# Patient Record
Sex: Female | Born: 2000 | Race: White | Hispanic: No | Marital: Single | Smoking: Never smoker
Health system: Southern US, Community
[De-identification: ages and names within clinical notes are randomized; demographics above are authoritative.]

---

## 2022-02-07 ENCOUNTER — Emergency Department: Payer: Managed Care, Other (non HMO)

## 2022-02-07 ENCOUNTER — Emergency Department
Admission: EM | Admit: 2022-02-07 | Discharge: 2022-02-07 | Disposition: A | Discharge status: Home / Self Care | Payer: Managed Care, Other (non HMO) | Attending: Student in an Organized Health Care Education/Training Program | Admitting: Student in an Organized Health Care Education/Training Program

## 2022-02-07 ENCOUNTER — Other Ambulatory Visit: Payer: Self-pay

## 2022-02-07 DIAGNOSIS — R209 Unspecified disturbances of skin sensation: Secondary | ICD-10-CM | POA: Insufficient documentation

## 2022-02-07 DIAGNOSIS — F430 Acute stress reaction: Secondary | ICD-10-CM | POA: Insufficient documentation

## 2022-02-07 DIAGNOSIS — R079 Chest pain, unspecified: Secondary | ICD-10-CM | POA: Diagnosis present

## 2022-02-07 DIAGNOSIS — R000 Tachycardia, unspecified: Secondary | ICD-10-CM | POA: Insufficient documentation

## 2022-02-07 LAB — CBC
HCT: 44.8 % (ref 36.0–46.0)
Hemoglobin: 14.8 g/dL (ref 12.0–15.0)
MCH: 29 pg (ref 26.0–34.0)
MCHC: 33 g/dL (ref 30.0–36.0)
MCV: 87.8 fL (ref 80.0–100.0)
Platelets: 332 10*3/uL (ref 150–400)
RBC: 5.1 MIL/uL (ref 3.87–5.11)
RDW: 13.1 % (ref 11.5–15.5)
WBC: 9.6 10*3/uL (ref 4.0–10.5)
nRBC: 0 % (ref 0.0–0.2)

## 2022-02-07 LAB — BASIC METABOLIC PANEL WITH GFR
Anion gap: 13 (ref 5–15)
BUN: 15 mg/dL (ref 6–20)
CO2: 25 mmol/L (ref 22–32)
Calcium: 9.5 mg/dL (ref 8.9–10.3)
Chloride: 102 mmol/L (ref 98–111)
Creatinine, Ser: 0.88 mg/dL (ref 0.44–1.00)
GFR, Estimated: >60 mL/min
Glucose, Bld: 126 mg/dL — ABNORMAL HIGH (ref 70–99)
Potassium: 3.5 mmol/L (ref 3.5–5.1)
Sodium: 140 mmol/L (ref 135–145)

## 2022-02-07 LAB — TROPONIN I (HIGH SENSITIVITY): Troponin I (High Sensitivity): <2 ng/L

## 2022-02-07 LAB — D-DIMER, QUANTITATIVE: D-Dimer, Quant: <0.27 ug/mL-FEU (ref 0.00–0.50)

## 2022-02-07 MED ORDER — DIAZEPAM 5 MG PO TABS
5.0000 mg | ORAL_TABLET | Freq: Once | ORAL | Status: AC
  Administered 2022-02-07: 5 mg via ORAL
  Filled 2022-02-07: qty 1

## 2022-02-07 NOTE — ED Notes (Signed)
Discharge instructions discussed with pt. Pt verbalized understanding with no questions at this time. Pt to go home with friend at bedside ?

## 2022-02-07 NOTE — ED Triage Notes (Signed)
Pt with c/o chest tightness and SOB since this am, pt with c/o left arm tingling. Pt states she has been under a lot of stress at school during finals.

## 2022-02-07 NOTE — ED Provider Notes (Signed)
[facility] Provider Note    Event Date/Time   First MD Initiated Contact with Patient 02/07/22 1944     (approximate)   History   Chest Pain   HPI  Brenda Mcknight is a 21 y.o. female history of anxiety and panic attacks presents to the ER for evaluation of chest pain shortness of breath tingling in her hands going on for most the day.  Felt like she was about to pass out after she got in the shower.  States that she does admit that she is stressed out over final exams this week.  She is not on birth control has an IUD.  Has not been taking any stimulants or excessive caffeine use.  Would like something for stress and anxiety.     Physical Exam   Triage Vital Signs: ED Triage Vitals  Enc Vitals Group     BP 02/07/22 1709 120/90     Pulse Rate 02/07/22 1709 94     Resp 02/07/22 1709 20     Temp 02/07/22 1709 98.4 F (36.9 C)     Temp Source 02/07/22 1709 Oral     SpO2 02/07/22 1709 100 %     Weight 02/07/22 1711 160 lb (72.6 kg)     Height 02/07/22 1711 5\' 9"  (1.753 m)     Head Circumference --      Peak Flow --      Pain Score 02/07/22 1711 4     Pain Loc --      Pain Edu? --      Excl. in GC? --     Most recent vital signs: Vitals:   02/07/22 1952 02/07/22 1955  BP:  119/81  Pulse: 75 76  Resp: 16 16  Temp:    SpO2: 100% 100%     Constitutional: Alert, anxious appearing Eyes: Conjunctivae are normal.  Head: Atraumatic. Nose: No congestion/rhinnorhea. Mouth/Throat: Mucous membranes are moist.   Neck: Painless ROM.  Cardiovascular:   Good peripheral circulation. No m/g/r Respiratory: Normal respiratory effort.  No retractions.  Gastrointestinal: Soft and nontender.  Musculoskeletal:  no deformity Neurologic:  MAE spontaneously. No gross focal neurologic deficits are appreciated.  Skin:  Skin is warm, dry and intact. No rash noted. Psychiatric: Mood and affect are anxious Speech and behavior are normal.    ED Results /  Procedures / Treatments   Labs (all labs ordered are listed, but only abnormal results are displayed) Labs Reviewed  BASIC METABOLIC PANEL - Abnormal; Notable for the following components:      Result Value   Glucose, Bld 126 (*)    All other components within normal limits  CBC  D-DIMER, QUANTITATIVE  POC URINE PREG, ED  TROPONIN I (HIGH SENSITIVITY)     EKG  ED ECG REPORT I, 02/09/22, the attending physician, personally viewed and interpreted this ECG.   Date: 02/07/2022  EKG Time: 17:20  Rate: 100  Rhythm: sinus  Axis: right  Intervals:normal  ST&T Change: no stemi, nonspecific st abn    RADIOLOGY Please see ED Course for my review and interpretation.  I personally reviewed all radiographic images ordered to evaluate for the above acute complaints and reviewed radiology reports and findings.  These findings were personally discussed with the patient.  Please see medical record for radiology report.    PROCEDURES:  Critical Care performed: No  Procedures   MEDICATIONS ORDERED IN ED: Medications  diazepam (VALIUM) tablet 5 mg (5 mg Oral Given  02/07/22 1955)     IMPRESSION / MDM / ASSESSMENT AND PLAN / ED COURSE  I reviewed the triage vital signs and the nursing notes.                              Differential diagnosis includes, but is not limited to, panix attack, ACS, pericarditis,, pe, dissection, pna, bronchitis, costochondritis  Patient presenting to the ER for evaluation of symptoms as described above.  Most clinically consistent with probable panic attack but given her description of events and her arrival with tachycardia on EKG blood work sent with above differential chest x-ray on my interpretation does not show any evidence of pneumothorax or infiltrates.  She does not have any wheezing on exam she is satting well she is hemodynamically stable.  Her abdominal exam is soft and benign.  Troponin negative.  Not consistent with pericarditis or  ACS.  Not consistent with dissection she is low risk by Wells criteria will order D-dimer to further restratify for PE.  Clinical Course as of 02/07/22 2028  Thu Feb 07, 2022  2027 Patient reassessed.  Feels significant proved after Valium.  Feels most clinically consistent with stress reaction her D-dimer is negative.  Is not consistent with ACS exam is otherwise benign and reassuring.  Patient feels safe at home is going back home and can see her PCP next week.  Discussed return precautions. [PR]    Clinical Course User Index [PR] Willy Eddy, MD     FINAL CLINICAL IMPRESSION(S) / ED DIAGNOSES   Final diagnoses:  Nonspecific chest pain  Stress reaction     Rx / DC Orders   ED Discharge Orders     None        Note:  This document was prepared using Dragon voice recognition software and may include unintentional dictation errors.    Willy Eddy, MD 02/07/22 2029

## 2022-05-14 ENCOUNTER — Ambulatory Visit (INDEPENDENT_AMBULATORY_CARE_PROVIDER_SITE_OTHER): Payer: Managed Care, Other (non HMO) | Admitting: Family Medicine

## 2022-05-14 ENCOUNTER — Other Ambulatory Visit: Payer: Self-pay | Admitting: Family Medicine

## 2022-05-14 ENCOUNTER — Other Ambulatory Visit: Payer: Self-pay

## 2022-05-14 ENCOUNTER — Encounter: Payer: Self-pay | Admitting: Family Medicine

## 2022-05-14 VITALS — BP 118/66 | HR 74 | Temp 98.1°F | Resp 18 | Ht 70.0 in | Wt 156.0 lb

## 2022-05-14 DIAGNOSIS — Z7189 Other specified counseling: Secondary | ICD-10-CM | POA: Diagnosis not present

## 2022-05-14 DIAGNOSIS — Z630 Problems in relationship with spouse or partner: Secondary | ICD-10-CM | POA: Diagnosis not present

## 2022-05-14 NOTE — Progress Notes (Signed)
[facility] [address] [place], [place] [address] Phone: [phone] Fax: [phone]   Office Visit Note  Patient Name: Brenda Mcknight  Date of [id number]  Med Rec number [id number]  Date of Service: 05/14/2022  Patient has no known allergies.  Chief Complaint  Patient presents with   Other     HPI  Here to discuss STI testing Has been with same partner for over two years - last had sex without a condom 3 days ago. Then 2 days ago learned he had slept with soemone else over the summer Would like STI tetsing No symptoms    Current Medication:  Outpatient Encounter Medications as of 05/14/2022  Medication Sig   sertraline (ZOLOFT) 25 MG tablet Take 25 mg by mouth daily.   No facility-administered encounter medications on file as of 05/14/2022.      Medical History: History reviewed. No pertinent past medical history.   Vital Signs: BP 118/66 (BP Location: Left Arm, Patient Position: Sitting, Cuff Size: Normal)   Pulse 74   Temp 98.1 F (36.7 C) (Tympanic)   Resp 18   Ht 5\' 10"  (1.778 m)   Wt 156 lb (70.8 kg)   SpO2 99%   BMI 22.38 kg/m    Review of Systems  Constitutional: Negative.   Genitourinary: Negative.   Psychiatric/Behavioral:         Appropriately upset - has discussed with her therapist      Physical Exam Constitutional:      Appearance: Normal appearance.  Neurological:     Mental Status: She is alert.  Psychiatric:        Mood and Affect: Mood normal.        Behavior: Behavior normal.        Thought Content: Thought content normal.        Judgment: Judgment normal.         Assessment/Plan:  1. Counseling for marital and partner problems Encouraged to accept that feelings of anger are normal given the situation      General Counseling: Jermiya verbalizes understanding of the findings of todays visit and agrees with plan of treatment. I have discussed any further diagnostic evaluation that may be  needed or ordered today. We also reviewed her medications today. she has been encouraged to call the office with any questions or concerns that should arise related to todays visit.   No orders of the defined types were placed in this encounter.   No orders of the defined types were placed in this encounter.   Time spent:10 Minutes   Dr Al Decant Tacara Hadlock ABFM University Physician

## 2022-05-28 ENCOUNTER — Other Ambulatory Visit: Payer: Self-pay | Admitting: Family Medicine

## 2022-05-28 ENCOUNTER — Encounter: Payer: Self-pay | Admitting: Family Medicine

## 2022-05-28 VITALS — BP 100/60 | HR 66 | Temp 98.0°F | Resp 18 | Ht 70.0 in | Wt 156.0 lb

## 2022-05-28 DIAGNOSIS — Z113 Encounter for screening for infections with a predominantly sexual mode of transmission: Secondary | ICD-10-CM

## 2022-05-28 NOTE — Progress Notes (Signed)
[facility] [address] [place], [place] [address] Phone: [phone] Fax: [phone]   Office Visit Note  Patient Name: Brenda Mcknight  Date of [id number]  Med Rec number [id number]  Date of Service: 05/28/2022  Patient has no known allergies.  Chief Complaint  Patient presents with   Other     Here for STI test only - see last OV note        Current Medication:  Outpatient Encounter Medications as of 05/28/2022  Medication Sig   sertraline (ZOLOFT) 25 MG tablet Take 25 mg by mouth daily.   No facility-administered encounter medications on file as of 05/28/2022.      Medical History: History reviewed. No pertinent past medical history.   Vital Signs: BP 100/60   Pulse 66   Temp 98 F (36.7 C)   Resp 18   Ht 5\' 10"  (1.778 m)   Wt 156 lb (70.8 kg)   SpO2 99%   BMI 22.38 kg/m    Review of Systems  Physical Exam    Assessment/Plan:  1. Screening examination for venereal disease Expect results in 48 to 72 hours - GC/Chlamydia Probe Amp      General Counseling: Brenda Mcknight verbalizes understanding of the findings of todays visit and agrees with plan of treatment. I have discussed any further diagnostic evaluation that may be needed or ordered today. We also reviewed her medications today. she has been encouraged to call the office with any questions or concerns that should arise related to todays visit.   Orders Placed This Encounter  Procedures   GC/Chlamydia Probe Amp    No orders of the defined types were placed in this encounter.   Time spent:5 Minutes   Dr Loreal Schuessler ABFM University Physician

## 2022-05-30 LAB — GC/CHLAMYDIA PROBE AMP
Chlamydia trachomatis, NAA: NEGATIVE
Neisseria Gonorrhoeae by PCR: NEGATIVE

## 2022-08-01 ENCOUNTER — Ambulatory Visit (INDEPENDENT_AMBULATORY_CARE_PROVIDER_SITE_OTHER): Payer: Managed Care, Other (non HMO) | Admitting: Adult Health

## 2022-08-01 ENCOUNTER — Encounter: Payer: Self-pay | Admitting: Adult Health

## 2022-08-01 VITALS — HR 104 | Temp 99.0°F

## 2022-08-01 DIAGNOSIS — N939 Abnormal uterine and vaginal bleeding, unspecified: Secondary | ICD-10-CM

## 2022-08-01 NOTE — Progress Notes (Signed)
[facility] [address] [place], [place] [address] Phone: [phone] Fax: [phone]   Office Visit Note  Patient Name: Brenda Mcknight  Date of [id number]  Med Rec number [id number]  Date of Service: 08/01/2022  Patient has no known allergies.  Chief Complaint  Patient presents with   IUD Check     HPI  Patient reports she had her normal period almost 3 weeks ago, but continued to bleed especially the day after sex.  She describes it sometimes as old/dark blood and other times bright red.  Its with wiping, and just at rest.   Has one female partner, not using condoms.  IUD in place for 3 years.   Current Medication:  Outpatient Encounter Medications as of 08/01/2022  Medication Sig   levonorgestrel (MIRENA) 20 MCG/DAY IUD 1 each by Intrauterine route once.   sertraline (ZOLOFT) 25 MG tablet Take 25 mg by mouth daily.   No facility-administered encounter medications on file as of 08/01/2022.      Medical History: No past medical history on file.   Vital Signs: Pulse (!) 104   Temp 99 F (37.2 C) (Tympanic)   SpO2 99%    Review of Systems  Constitutional:  Negative for fatigue and fever.  Eyes:  Negative for pain and itching.  Cardiovascular:  Negative for chest pain.  Gastrointestinal:  Negative for diarrhea, nausea and vomiting.  Genitourinary:  Positive for vaginal bleeding. Negative for difficulty urinating, dyspareunia, flank pain, menstrual problem, pelvic pain and urgency.    Physical Exam Genitourinary:    General: Normal vulva.     Vagina: No vaginal discharge.     Cervix: No discharge, friability, lesion, erythema or cervical bleeding.     Comments: IUD strings present through Cervix.    Assessment/Plan: 1. Vaginal bleeding No signs of trauma in vaginal canal, or at cervical opening.  Will Send urine test for precaution.   - Chlamydia/Gonococcus/Trichomonas, NAA     General Counseling: Mona verbalizes understanding of  the findings of todays visit and agrees with plan of treatment. I have discussed any further diagnostic evaluation that may be needed or ordered today. We also reviewed her medications today. she has been encouraged to call the office with any questions or concerns that should arise related to todays visit.   Orders Placed This Encounter  Procedures   Chlamydia/Gonococcus/Trichomonas, NAA    No orders of the defined types were placed in this encounter.   Time spent:20 Minutes Time spent includes review of chart, medications, test results, and follow up plan with the patient.    Johnna Acosta AGNP-C Nurse Practitioner

## 2022-08-05 ENCOUNTER — Encounter: Payer: Self-pay | Admitting: Adult Health

## 2022-08-05 LAB — CHLAMYDIA/GONOCOCCUS/TRICHOMONAS, NAA
Chlamydia by NAA: NEGATIVE
Gonococcus by NAA: NEGATIVE
Trich vag by NAA: NEGATIVE

## 2022-12-04 ENCOUNTER — Ambulatory Visit: Payer: Managed Care, Other (non HMO) | Admitting: Adult Health

## 2022-12-04 ENCOUNTER — Encounter: Payer: Self-pay | Admitting: Adult Health

## 2022-12-04 VITALS — BP 100/62 | HR 79 | Temp 98.2°F | Wt 164.0 lb

## 2022-12-04 DIAGNOSIS — J301 Allergic rhinitis due to pollen: Secondary | ICD-10-CM

## 2022-12-04 MED ORDER — ALBUTEROL SULFATE HFA 108 (90 BASE) MCG/ACT IN AERS: INHALATION_SPRAY | RESPIRATORY_TRACT | 0 refills | Status: AC

## 2022-12-04 NOTE — Progress Notes (Signed)
[address] [place],  [address] Phone: [phone] Fax: [phone]   Office Visit Note  Patient Name: Brenda Mcknight  Date of [id number]  Med Rec number [id number]  Date of Service: 12/04/2022  Patient has no known allergies.  Chief Complaint  Patient presents with   Cough     Cough Associated symptoms include chills. Pertinent negatives include no chest pain, ear pain or eye redness.    Patient is here reporting 5 days of feeling bad.  She describes loss of voice, congestion, chills fatigue. She does have allergies and felt some chest tightness so she used her inhaler, and took cold/flu medication which helped.  Denies sick contacts.   Current Medication:  Outpatient Encounter Medications as of 12/04/2022  Medication Sig   albuterol (VENTOLIN HFA) 108 (90 Base) MCG/ACT inhaler Use 2 puffs three times daily for 2 weeks then may use 2 puffs every 4-6 hours as needed after that.   levonorgestrel (MIRENA) 20 MCG/DAY IUD 1 each by Intrauterine route once.   sertraline (ZOLOFT) 25 MG tablet Take 25 mg by mouth daily.   No facility-administered encounter medications on file as of 12/04/2022.      Medical History: History reviewed. No pertinent past medical history.   Vital Signs: BP 100/62   Pulse 79   Temp 98.2 F (36.8 C) (Tympanic)   Wt 164 lb (74.4 kg)   SpO2 99%   BMI 23.53 kg/m    Review of Systems  Constitutional:  Positive for chills and fatigue.  HENT:  Positive for congestion and voice change. Negative for ear pain and sinus pressure.   Eyes:  Negative for pain, redness and itching.  Respiratory:  Positive for cough.   Cardiovascular:  Negative for chest pain.  Gastrointestinal:  Negative for diarrhea, nausea and vomiting.    Physical Exam Vitals and nursing note reviewed.  Constitutional:      Appearance: Normal appearance.  HENT:     Head: Normocephalic.     Right Ear: Tympanic membrane and ear canal  normal.     Left Ear: Tympanic membrane and ear canal normal.     Nose:     Comments: Pale nares    Mouth/Throat:     Mouth: Mucous membranes are moist.  Eyes:     Pupils: Pupils are equal, round, and reactive to light.  Cardiovascular:     Rate and Rhythm: Normal rate.  Pulmonary:     Effort: Pulmonary effort is normal.     Breath sounds: Normal breath sounds.  Lymphadenopathy:     Cervical: No cervical adenopathy.  Neurological:     Mental Status: She is alert.     Assessment/Plan: 1. Seasonal allergic rhinitis due to pollen Discussed using Daily allergy medication such as Zyrtec or Claritin. Also instructed patient to use Flonase, Two sprays in each nostril twice daily.  Follow up via MyChart messenger if symptoms fail to improve or may return to clinic as needed for worsening symptoms.   Discussed zpak for lung congestion if symptoms worsen     General Counseling: Laverna verbalizes understanding of the findings of todays visit and agrees with plan of treatment. I have discussed any further diagnostic evaluation that may be needed or ordered today. We also reviewed her medications today. she has been encouraged to call the office with any questions or concerns that should arise related to todays visit.   No orders of the defined types were placed in this encounter.  Meds ordered this encounter  Medications   albuterol (VENTOLIN HFA) 108 (90 Base) MCG/ACT inhaler    Sig: Use 2 puffs three times daily for 2 weeks then may use 2 puffs every 4-6 hours as needed after that.    Dispense:  8 g    Refill:  0    Time spent:20 Minutes Time spent includes review of chart, medications, test results, and follow up plan with the patient.    Kendell Bane AGNP-C Nurse Practitioner

## 2023-01-10 ENCOUNTER — Other Ambulatory Visit: Payer: Self-pay | Admitting: Adult Health

## 2023-05-11 IMAGING — CR DG CHEST 2V
1 series · 2 of 2 positions shown · non-contrast
Comparison: None Available.

CLINICAL DATA: Chest tightness.  History of asthma.

EXAM:
CHEST - 2 VIEW

[Series 1: dg chest 2 view · 0.14mm/px · 2 of 2 slices shown]
[im 1/2]
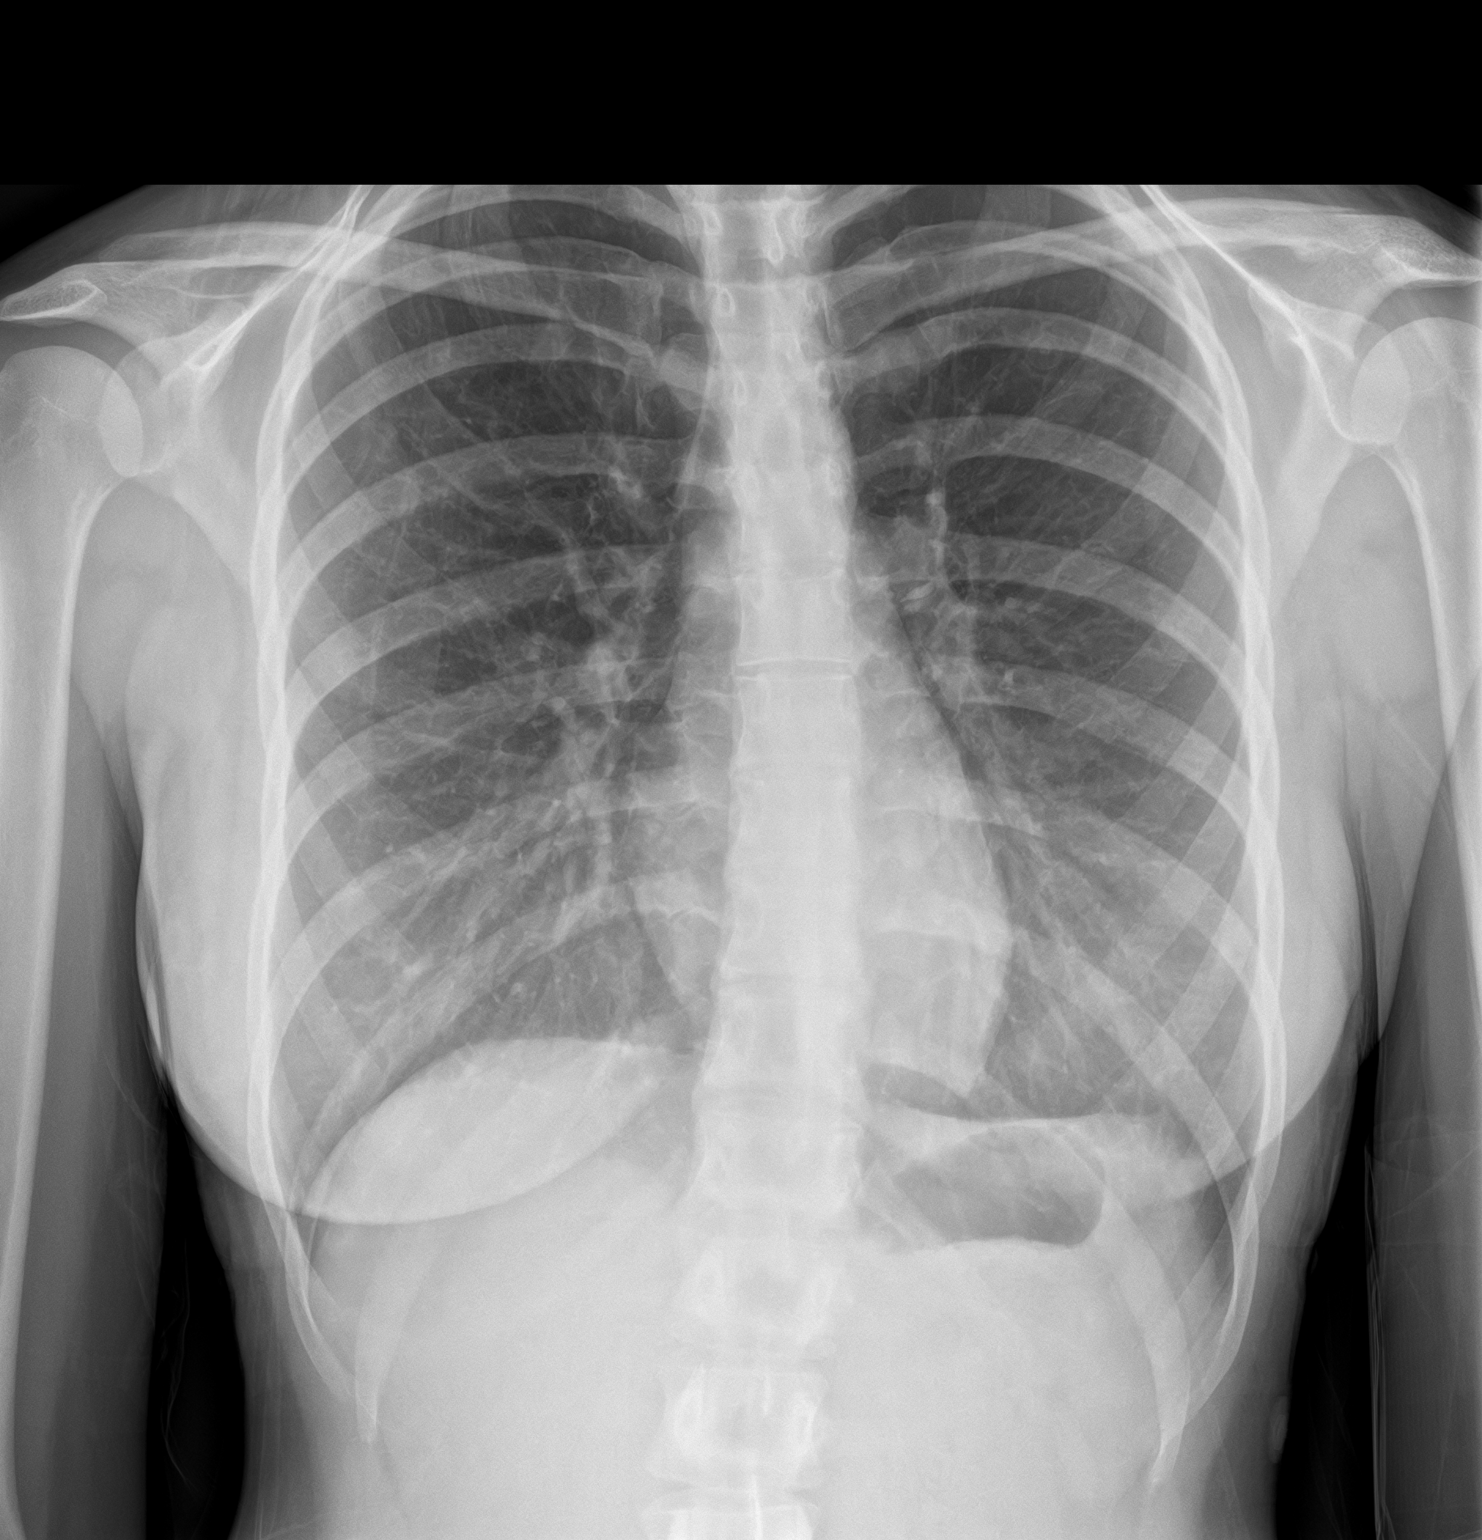
[im 2/2]
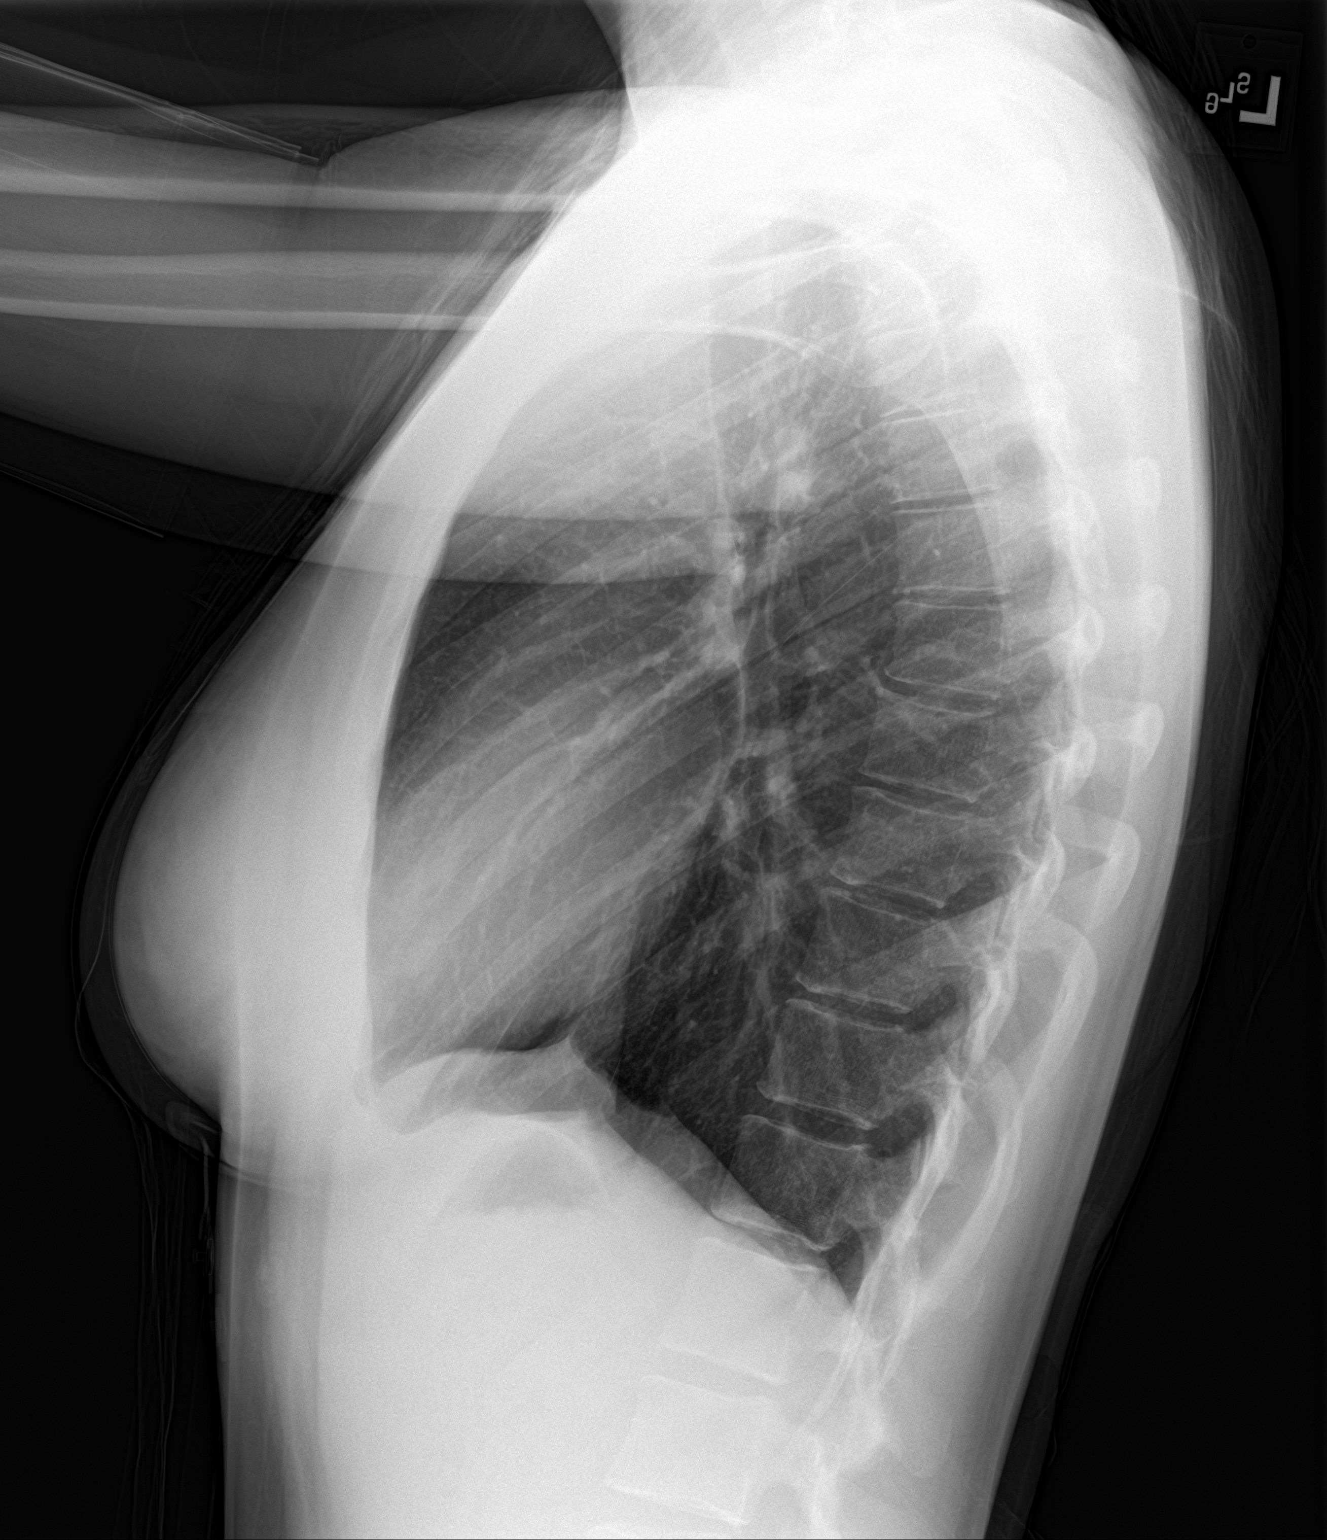

[2 of 2 positions shown; findings below may reference images not displayed]

FINDINGS: The heart size and mediastinal contours are within normal limits. No
focal airspace consolidation. No pleural effusion. No pneumothorax.
The visualized skeletal structures are unremarkable.
IMPRESSION: No acute cardiopulmonary disease.
